# Patient Record
Sex: Female | Born: 1950 | Race: White | Hispanic: No | Marital: Married | Smoking: Never smoker
Health system: Southern US, Community
[De-identification: ages and names within clinical notes are randomized; demographics above are authoritative.]

## PROBLEM LIST (undated history)

## (undated) DIAGNOSIS — N838 Other noninflammatory disorders of ovary, fallopian tube and broad ligament: Secondary | ICD-10-CM

## (undated) DIAGNOSIS — K219 Gastro-esophageal reflux disease without esophagitis: Secondary | ICD-10-CM

## (undated) HISTORY — PX: TONSILLECTOMY: SUR1361

## (undated) HISTORY — PX: DIAGNOSTIC LAPAROSCOPY: SUR761

## (undated) HISTORY — DX: Other noninflammatory disorders of ovary, fallopian tube and broad ligament: N83.8

## (undated) HISTORY — DX: Gastro-esophageal reflux disease without esophagitis: K21.9

---

## 1997-12-10 ENCOUNTER — Other Ambulatory Visit: Admission: RE | Admit: 1997-12-10 | Discharge: 1997-12-10 | Payer: Self-pay | Admitting: Obstetrics & Gynecology

## 1998-12-20 ENCOUNTER — Other Ambulatory Visit: Admission: RE | Admit: 1998-12-20 | Discharge: 1998-12-20 | Payer: Self-pay | Admitting: Obstetrics & Gynecology

## 1999-06-29 ENCOUNTER — Emergency Department (HOSPITAL_COMMUNITY): Admission: EM | Admit: 1999-06-29 | Discharge: 1999-06-29 | Payer: Self-pay | Admitting: Emergency Medicine

## 1999-07-06 ENCOUNTER — Emergency Department (HOSPITAL_COMMUNITY): Admission: EM | Admit: 1999-07-06 | Discharge: 1999-07-06 | Payer: Self-pay | Admitting: Emergency Medicine

## 2000-03-15 ENCOUNTER — Emergency Department (HOSPITAL_COMMUNITY): Admission: EM | Admit: 2000-03-15 | Discharge: 2000-03-15 | Payer: Self-pay | Admitting: Emergency Medicine

## 2000-03-15 ENCOUNTER — Encounter: Payer: Self-pay | Admitting: Emergency Medicine

## 2000-05-21 ENCOUNTER — Other Ambulatory Visit: Admission: RE | Admit: 2000-05-21 | Discharge: 2000-05-21 | Payer: Self-pay | Admitting: Obstetrics & Gynecology

## 2001-09-09 ENCOUNTER — Other Ambulatory Visit: Admission: RE | Admit: 2001-09-09 | Discharge: 2001-09-09 | Payer: Self-pay | Admitting: Obstetrics & Gynecology

## 2003-02-20 ENCOUNTER — Other Ambulatory Visit: Admission: RE | Admit: 2003-02-20 | Discharge: 2003-02-20 | Payer: Self-pay | Admitting: Obstetrics & Gynecology

## 2003-05-14 ENCOUNTER — Encounter (INDEPENDENT_AMBULATORY_CARE_PROVIDER_SITE_OTHER): Payer: Self-pay | Admitting: Specialist

## 2003-05-14 ENCOUNTER — Ambulatory Visit (HOSPITAL_COMMUNITY): Admission: RE | Admit: 2003-05-14 | Discharge: 2003-05-14 | Payer: Self-pay | Admitting: Obstetrics & Gynecology

## 2006-06-02 ENCOUNTER — Other Ambulatory Visit: Admission: RE | Admit: 2006-06-02 | Discharge: 2006-06-02 | Payer: Self-pay | Admitting: Family Medicine

## 2006-06-10 ENCOUNTER — Encounter: Admission: RE | Admit: 2006-06-10 | Discharge: 2006-06-10 | Payer: Self-pay | Admitting: Family Medicine

## 2006-06-22 ENCOUNTER — Encounter: Admission: RE | Admit: 2006-06-22 | Discharge: 2006-06-22 | Payer: Self-pay | Admitting: Family Medicine

## 2007-09-15 ENCOUNTER — Encounter: Admission: RE | Admit: 2007-09-15 | Discharge: 2007-09-15 | Payer: Self-pay | Admitting: Family Medicine

## 2007-09-20 ENCOUNTER — Other Ambulatory Visit: Admission: RE | Admit: 2007-09-20 | Discharge: 2007-09-20 | Payer: Self-pay | Admitting: Family Medicine

## 2007-09-26 ENCOUNTER — Encounter: Admission: RE | Admit: 2007-09-26 | Discharge: 2007-09-26 | Payer: Self-pay | Admitting: Family Medicine

## 2007-11-09 ENCOUNTER — Other Ambulatory Visit: Admission: RE | Admit: 2007-11-09 | Discharge: 2007-11-09 | Payer: Self-pay | Admitting: Family Medicine

## 2009-10-25 ENCOUNTER — Encounter: Admission: RE | Admit: 2009-10-25 | Discharge: 2009-10-25 | Payer: Self-pay | Admitting: Family Medicine

## 2010-01-27 ENCOUNTER — Ambulatory Visit (HOSPITAL_COMMUNITY): Admission: RE | Admit: 2010-01-27 | Discharge: 2010-01-27 | Payer: Self-pay | Admitting: Family Medicine

## 2010-01-30 ENCOUNTER — Encounter: Admission: RE | Admit: 2010-01-30 | Discharge: 2010-01-30 | Payer: Self-pay | Admitting: Family Medicine

## 2010-02-26 ENCOUNTER — Ambulatory Visit
Admission: RE | Admit: 2010-02-26 | Discharge: 2010-02-26 | Payer: Self-pay | Source: Home / Self Care | Admitting: Gynecologic Oncology

## 2010-04-11 ENCOUNTER — Encounter
Admission: RE | Admit: 2010-04-11 | Discharge: 2010-04-11 | Payer: Self-pay | Source: Home / Self Care | Attending: Family Medicine | Admitting: Family Medicine

## 2010-04-13 ENCOUNTER — Encounter: Payer: Self-pay | Admitting: Family Medicine

## 2010-04-14 ENCOUNTER — Encounter: Payer: Self-pay | Admitting: Family Medicine

## 2010-04-28 ENCOUNTER — Other Ambulatory Visit: Payer: Self-pay | Admitting: Family Medicine

## 2010-04-28 ENCOUNTER — Other Ambulatory Visit (HOSPITAL_COMMUNITY)
Admission: RE | Admit: 2010-04-28 | Discharge: 2010-04-28 | Disposition: A | Discharge status: Home / Self Care | Payer: BC Managed Care – PPO | Source: Ambulatory Visit | Attending: Family Medicine | Admitting: Family Medicine

## 2010-04-28 DIAGNOSIS — Z124 Encounter for screening for malignant neoplasm of cervix: Secondary | ICD-10-CM | POA: Insufficient documentation

## 2010-08-08 NOTE — Op Note (Signed)
NAMECORTEZ, STEELMAN                     ACCOUNT NO.:  [id number]   MEDICAL RECORD NO.:  [id number]                   PATIENT TYPE:  AMB   LOCATION:  SDC                                  FACILITY:  [facility]   PHYSICIAN:  Freddy Finner, M.D.                DATE OF BIRTH:  1950/05/25   DATE OF PROCEDURE:  05/14/2003  DATE OF DISCHARGE:                                 OPERATIVE REPORT   PREOPERATIVE DIAGNOSIS:  Postmenopausal bleeding, endometrial polyp,  endometrial fibroids.   POSTOPERATIVE DIAGNOSIS:  Postmenopausal bleeding, endometrial polyp,  endometrial fibroids.  Plus thick, large uterine septum.   PROCEDURE:  Hysteroscopy, D&C, resection of endometrial polyp.   ANESTHESIA:  General.   COMPLICATIONS:  None.   ESTIMATED BLOOD LOSS:  10 mL.   SORBITOL DEFICIT:  60 mL.   INDICATIONS FOR PROCEDURE:  The patient is a 60 year old who had an episode  of postmenopausal bleeding following starting hormonal replacement therapy.  Sonohysterogram in the office did reveal an endometrial polyp.  She is now  admitted for hysteroscopy and D&C.   FINDINGS:  Operative findings were recorded in still photographs which are  retained in the office record.   DESCRIPTION OF PROCEDURE:  The patient was admitted on the morning of  surgery and brought to the operating room.  There, placed under adequate  general anesthesia, and placed in the dorsal lithotomy position using Allen  stirrups system.  Betadine prep of mons, perineum, and vagina was carried  out in the usual fashion. Sterile drapes were applied.  Bivalve speculum was  introduced.  Cervix was visualized, grasped on the anterior cervical lip  with a single tooth tenaculum.  Paracervical block was placed using 10 mL of  1% plain Xylocaine 5 mL were injected at each of the 4 and 8 o'clock  positions.  Uterus sounded to approximately 9 cm.  The cervix was  progressively dilated to 23 with [facility] dilators.  12.5 degree ACMI  hysteroscope was introduced using 3% Sorbitol as the distending medium.  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  The polyp was actually on the anterior surface of the  endometrium just below the septum.  Gentle thorough curettage and  exploration with Randall stone forceps was carried out.  Reinspection  revealed adequate resection of the polyp and adequate sampling of the  endometrium.  The instruments were then removed. The patient was awakened  and taken to the recovery room in good condition.                                               Freddy Finner, M.D.  WRN/MEDQ  D:  05/14/2003  T:  05/14/2003  Job:  [id number]

## 2010-08-25 ENCOUNTER — Other Ambulatory Visit: Payer: Self-pay | Admitting: Gynecologic Oncology

## 2010-08-25 DIAGNOSIS — N83202 Unspecified ovarian cyst, left side: Secondary | ICD-10-CM

## 2010-08-25 DIAGNOSIS — N838 Other noninflammatory disorders of ovary, fallopian tube and broad ligament: Secondary | ICD-10-CM

## 2010-09-08 ENCOUNTER — Other Ambulatory Visit: Payer: BC Managed Care – PPO

## 2010-09-08 ENCOUNTER — Ambulatory Visit
Admission: RE | Admit: 2010-09-08 | Discharge: 2010-09-08 | Disposition: A | Discharge status: Home / Self Care | Payer: BC Managed Care – PPO | Source: Ambulatory Visit | Attending: Gynecologic Oncology | Admitting: Gynecologic Oncology

## 2010-09-08 DIAGNOSIS — N838 Other noninflammatory disorders of ovary, fallopian tube and broad ligament: Secondary | ICD-10-CM

## 2010-09-10 ENCOUNTER — Other Ambulatory Visit: Payer: Self-pay | Admitting: Gynecologic Oncology

## 2010-09-10 ENCOUNTER — Ambulatory Visit: Payer: BC Managed Care – PPO | Attending: Gynecologic Oncology | Admitting: Gynecologic Oncology

## 2010-09-10 DIAGNOSIS — R32 Unspecified urinary incontinence: Secondary | ICD-10-CM | POA: Insufficient documentation

## 2010-09-10 DIAGNOSIS — N9489 Other specified conditions associated with female genital organs and menstrual cycle: Secondary | ICD-10-CM | POA: Insufficient documentation

## 2010-09-10 DIAGNOSIS — D251 Intramural leiomyoma of uterus: Secondary | ICD-10-CM | POA: Insufficient documentation

## 2010-09-10 DIAGNOSIS — N83209 Unspecified ovarian cyst, unspecified side: Secondary | ICD-10-CM

## 2010-09-11 NOTE — Consult Note (Signed)
Sophia [name], Sophia [name]           ACCOUNT NO.:  [id number]  MEDICAL RECORD NO.:  [id number]  LOCATION:  GYN                          FACILITY:  [facility]  PHYSICIAN:  Lenea Bywater A. 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  This was initially noted in November 2011.  It was followed up with an MRI of the pelvis in November 2011 that revealed a 2 mm endometrial stripe in the uterus. There were small intramural myomas.  The right ovary was small, measuring 19 x 9 x 20 mm, left ovary was slightly larger, measuring 20 x 26 x 29 mm with several foci of low signal intensity along the inferior margin of the ovary, which corresponded to areas of calcification noted on ultrasound.  The conclusion was that this could be a small solid neoplasm and close followup was requested and recommended.  I saw her, we talked about getting a CA-125, which was normal and after our discussion, she wish to opt for close followup.  She was seen by Dr. Duard Larsen regarding her incontinence and she is currently undergoing PT and that is helping significantly with her leakage.  She did undergo a repeat ultrasound on June 18th.  The uterus measured 7.7 x 3.1 x 4.4 cm.  The myometrium is heterogeneous consistent with her known fibroids. The endometrial thickness is normal at 2.5 mm.  The right ovary is 2.8 x 1.1 x 1.9 cm and has a normal appearance.  The left ovary measures 2.9 x 1.5 x 2.8 cm.  There is a focal mass measuring 1.6 x 1.3 x 1.6 cm, which contains a diffuse echoes with posterior acoustical shadowing compatible with calcifications.  There has been no  significant interval change in the size or appearance noted to that from November 2011.  They state there are no features to suggest a dermoid on MRI despite the lack of growth over 6 months, suggesting a benign etiology of low malignant potential, early ovarian carcinoma could not be excluded, and they recommend followup in 3-6 months.  The patient has been quite some time discussing these options today.  She is currently asymptomatic, does not have any pain.  Her incontinence is better.  We discussed the options of either proceeding with surgery at this point, waiting to see if she has pain, waiting to see if she needs a urologic procedure done, etc.  After discussing all the options if she is not in pain and does not require any surgery from a urologic perspective, she would like to continue following this conservatively and avoid surgery, which I think is quite reasonable.  PHYSICAL EXAMINATION:  VITAL SIGNS:  Weight 149 pounds, which is down 4 pounds from her last visit; blood pressure 110/68; pulse 76. GENERAL:  A well-nourished, well-developed female, in no acute distress.  Physical examination was  deferred.  20 minutes face-to-face time was spent discussing the ultrasound from today as well as going back to those in November and the MRI.  After our discussion, the patient wishes to follow this conservatively and we will schedule her for a repeat ultrasound in 3 months and visit with me that same day.  She understands that while we do not believe this is a malignant process, the only way to know that for certain would be to proceed with surgical intervention, but at this point she is comfortable not doing that.  She will call me if there are any interval changes and any symptoms prior to that.     Sophia [name] A. Duard Brady, MD     PAG/MEDQ  D:  09/10/2010  T:  09/11/2010  Job:  [id number]  cc:   Pam Drown, M.D. Fax: [phone]  Telford Nab, R.N. [address] [place], [place] [address]  Randye Lobo, M.D. Fax: [phone]  Electronically Signed by Cleda Mccreedy MD on 09/11/2010 02:54:08 PM

## 2010-12-09 ENCOUNTER — Ambulatory Visit
Admission: RE | Admit: 2010-12-09 | Discharge: 2010-12-09 | Disposition: A | Discharge status: Home / Self Care | Payer: BC Managed Care – PPO | Source: Ambulatory Visit | Attending: Gynecologic Oncology | Admitting: Gynecologic Oncology

## 2010-12-09 ENCOUNTER — Other Ambulatory Visit (HOSPITAL_COMMUNITY): Payer: BC Managed Care – PPO

## 2010-12-09 DIAGNOSIS — N83209 Unspecified ovarian cyst, unspecified side: Secondary | ICD-10-CM

## 2010-12-10 ENCOUNTER — Ambulatory Visit: Payer: BC Managed Care – PPO | Attending: Gynecologic Oncology | Admitting: Gynecologic Oncology

## 2010-12-10 DIAGNOSIS — D259 Leiomyoma of uterus, unspecified: Secondary | ICD-10-CM | POA: Insufficient documentation

## 2010-12-10 DIAGNOSIS — M2170 Unequal limb length (acquired), unspecified site: Secondary | ICD-10-CM | POA: Insufficient documentation

## 2010-12-10 DIAGNOSIS — R32 Unspecified urinary incontinence: Secondary | ICD-10-CM | POA: Insufficient documentation

## 2010-12-10 DIAGNOSIS — N839 Noninflammatory disorder of ovary, fallopian tube and broad ligament, unspecified: Secondary | ICD-10-CM | POA: Insufficient documentation

## 2010-12-10 DIAGNOSIS — M129 Arthropathy, unspecified: Secondary | ICD-10-CM | POA: Insufficient documentation

## 2010-12-12 NOTE — Consult Note (Signed)
NAMEYURIANA, [name]           ACCOUNT NO.:  [id number]  MEDICAL RECORD NO.:  [id number]  LOCATION:  GYN                          FACILITY:  [facility]  PHYSICIAN:  Estefano Victory A. Duard Brady, MD    DATE OF BIRTH:  13-Feb-1951  DATE OF CONSULTATION:  12/10/2010 DATE OF DISCHARGE:                                CONSULTATION   HISTORY OF PRESENT ILLNESS:  Sophia [name] is a very pleasant 60 year old who was initially referred to [place] secondary to pain, which prompted a workup including an ultrasound that revealed a left ovarian complex avascular mass measuring 1.9 x 1.7 x 1.6 cm.  This was initially noticed in November 2011.  She had an MRI of the pelvis at that time that revealed some intrauterine myomas, a 2-mm endometrial stripe.  The left ovary was slightly larger than the right ovary, measuring 2 x 2.6 x 2.9 cm with several foci of low-signal intensity along the inferior margin of the ovary corresponding to areas of calcification noted on ultrasound.  CA-125 was normal.  Serial ultrasounds have been unremarkable.  After a lengthy discussion of the patient with me, which is well documented in my note from June 20, she opted for close followup.  She had a repeat ultrasound on September 18 that revealed the uterus to be 8.2 x 3.6 x 5.3 cm with stable small uterine fibroids with calcifications.  She has a normal endometrial thickness at 1.4 mm.  The right ovary is normal, measuring 3 x 1.1 x 1.4 cm with no cysts or masses.  Within the left ovary, which measures 3.5 x 1.3 x 2.2 cm, there was a stable, partially calcified shadowing 1.9 x 1.2 x 2 cm lesion associated with the left ovary that is felt most likely to be benign giving the stability and a prior CT scan appearance.  She comes in today to discuss these findings.  She is overall doing quite well.  She has been followed by Dr. Conley Simmonds for urinary incontinence and is undergoing physical therapy and biofeedback at [facility].   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  Denies any pain or bleeding.  Her incontinence is improved significantly.  Before she was changing a pad four to five times a day and the pads would be full of urine.  Now she will go all day long without having to wear or change a pad, occasionally when she wore a pad, but feels that it is usually when she is drinking a lot of fluids.  She denies any pelvic pain or pressure, early satiety, other change in bowel or bladder habits.  PHYSICAL EXAMINATION:  VITAL SIGNS:  Weight 151 pounds, blood pressure 100/60, pulse 70, respirations 20, temperature 97.8.  GENERAL:  Well- nourished, well-developed female in no acute distress.  Physical examination was deferred.  ASSESSMENT/PLAN:  15 minutes face-to-face time was spent with the patient going over her ultrasound and plan of care.  At this point, she would very much like to continue following this ovarian mass  and does not want to have surgery if at all possible.  She is very pleased with how she is doing with her biofeedback and her physical therapy.  The plan at this point is that she will repeat an ultrasound in 3 months and return to see me at that time.  If it continues to be stable, there will be 1 year follow up instability, and we can either choose to not follow it any more, though she states that if she ends up needing to have surgery for her urinary incontinence, she may want to have it done at the same time.  I discussed with her that we can clearly talk about that.  However, if she needs a vaginal procedure for her urinary incontinence, it may not make sense to proceed with an abdominal procedure at that same time, which she clearly understands.  She is aware and alerted to the signs and symptoms of ovarian cancer.  Will  contact us if there is any issues prior to her next visit.     Kaelon Weekes A. Duard Brady, MD     PAG/MEDQ  D:  12/10/2010  T:  12/10/2010  Job:  [id number]  cc:   Pam Drown, M.D. Fax: [phone]  Telford Nab, R.N. [address] [place], [place] [address]  Randye Lobo, M.D. Fax: [phone]  Electronically Signed by Cleda Mccreedy MD on 12/12/2010 07:48:42 AM

## 2010-12-23 ENCOUNTER — Other Ambulatory Visit: Payer: Self-pay | Admitting: Gynecologic Oncology

## 2010-12-23 DIAGNOSIS — N83202 Unspecified ovarian cyst, left side: Secondary | ICD-10-CM

## 2011-02-18 ENCOUNTER — Other Ambulatory Visit: Payer: Self-pay | Admitting: Family Medicine

## 2011-02-18 DIAGNOSIS — Z1231 Encounter for screening mammogram for malignant neoplasm of breast: Secondary | ICD-10-CM

## 2011-03-04 ENCOUNTER — Ambulatory Visit
Admission: RE | Admit: 2011-03-04 | Discharge: 2011-03-04 | Disposition: A | Discharge status: Home / Self Care | Payer: BC Managed Care – PPO | Source: Ambulatory Visit | Attending: Gynecologic Oncology | Admitting: Gynecologic Oncology

## 2011-03-04 DIAGNOSIS — N83202 Unspecified ovarian cyst, left side: Secondary | ICD-10-CM

## 2011-03-05 ENCOUNTER — Ambulatory Visit
Admission: RE | Admit: 2011-03-05 | Discharge: 2011-03-05 | Disposition: A | Discharge status: Home / Self Care | Payer: BC Managed Care – PPO | Source: Ambulatory Visit | Attending: Family Medicine | Admitting: Family Medicine

## 2011-03-05 DIAGNOSIS — Z1231 Encounter for screening mammogram for malignant neoplasm of breast: Secondary | ICD-10-CM

## 2011-03-10 ENCOUNTER — Encounter: Payer: Self-pay | Admitting: Gynecologic Oncology

## 2011-03-11 ENCOUNTER — Encounter: Payer: Self-pay | Admitting: Gynecologic Oncology

## 2011-03-11 ENCOUNTER — Ambulatory Visit: Payer: BC Managed Care – PPO | Attending: Gynecologic Oncology | Admitting: Gynecologic Oncology

## 2011-03-11 VITALS — BP 120/64 | HR 70 | Temp 98.0°F | Resp 20 | Ht 60.0 in | Wt 148.0 lb

## 2011-03-11 DIAGNOSIS — N839 Noninflammatory disorder of ovary, fallopian tube and broad ligament, unspecified: Secondary | ICD-10-CM | POA: Insufficient documentation

## 2011-03-11 DIAGNOSIS — N838 Other noninflammatory disorders of ovary, fallopian tube and broad ligament: Secondary | ICD-10-CM | POA: Insufficient documentation

## 2011-03-11 DIAGNOSIS — R32 Unspecified urinary incontinence: Secondary | ICD-10-CM | POA: Insufficient documentation

## 2011-03-11 DIAGNOSIS — K219 Gastro-esophageal reflux disease without esophagitis: Secondary | ICD-10-CM | POA: Insufficient documentation

## 2011-03-11 NOTE — Progress Notes (Signed)
Consult Note: Gyn-Onc  Sophia [name] 60 y.o. female  CC:  Chief Complaint  Patient presents with  . Ovarian mass    Follow up    HPI: HISTORY OF PRESENT ILLNESS: Sophia [name] is a very pleasant 60 year old who was initially referred to [place] secondary to pain, which prompted a workup including an ultrasound that revealed a left ovarian complex avascular mass measuring 1.9 x 1.7 x 1.6 cm. This was initially noticed in November 2011. She had an MRI of the pelvis at that time that revealed some intrauterine myomas, a 2-mm endometrial stripe. The left  ovary was slightly larger than the right ovary, measuring 2 x 2.6 x 2.9 cm with several foci of low-signal intensity along the inferior margin  of the ovary corresponding to areas of calcification noted on ultrasound. CA-125 was normal. Serial ultrasounds have been  unremarkable. After a lengthy discussion of the patient with me, which is well documented in my note from June 20, she opted for close  followup. She had a repeat ultrasound on September 18 that revealed the uterus to be 8.2 x 3.6 x 5.3 cm with stable small uterine fibroids with  calcifications. She has a normal endometrial thickness at 1.4 mm. The right ovary is normal, measuring 3 x 1.1 x 1.4 cm with no cysts or  masses. Within the left ovary, which measures 3.5 x 1.3 x 2.2 cm, there was a stable, partially calcified shadowing 1.9 x 1.2 x 2 cm lesion  associated with the left ovary that is felt most likely to be benign giving the stability and a prior CT scan appearance.   Repeat ultrasound was performed December 12 been compared ultrasounds from September of 2000, 02/22/2011, and November 2011. It revealed the left ovary to measure 2.9 but 1.9 x 2.3 cm. The calcified solid lesion again was seen in the inferior aspect of the ovary measuring 1.8 x 1.1cm x 1.6 cm. It is unchanged.   She comes in today to discuss these findings. She is overall doing quite well. She has been followed  by Dr. Conley Simmonds for urinary incontinence and is undergoing physical therapy and biofeedback at [facility].Denies any pain or bleeding. Her incontinence is improved significantly. Before she was changing a pad four to five times a day and the pads would be full of urine. Now she will go all day long without having to wear or change a pad, occasionally when she wore a pad, but feels that it is usually when she is drinking a lot of fluids. She denies any pelvic pain or pressure, early satiety, other change in bowel or bladder habits. She feels that there are certain foods and drinks it might make her incontinence worse but overall there's been a significant improvement.  Interval History:   Review of Systems  Current Meds:  Outpatient Encounter Prescriptions as of 03/11/2011  Medication Sig Dispense Refill  . VITAMIN D, CHOLECALCIFEROL, PO Take by mouth.          Allergy: No Known Allergies  Social Hx:   History   Social History  . Marital Status: Married    Spouse Name: N/A    Number of Children: N/A  . Years of Education: N/A   Occupational History  . Not on file.   Social History Main Topics  . Smoking status: Never Smoker   . Smokeless tobacco: Not on file  . Alcohol Use: Yes     minimal  . Drug Use: No  .  Sexually Active: No   Other Topics Concern  . Not on file   Social History Narrative  . No narrative on file    Past Surgical Hx:  Past Surgical History  Procedure Date  . Tonsillectomy   . Diagnostic laparoscopy 15 yrs ago    Past Medical Hx:  Past Medical History  Diagnosis Date  . Urinary incontinence   . GERD (gastroesophageal reflux disease)   . Ovarian mass     left    Family Hx:  Family History  Problem Relation Age of Onset  . Lupus Mother   . COPD Mother   . Uterine cancer Other     Vitals:  Blood pressure 120/64, pulse 70, temperature 98 F (36.7 C), resp. rate 20, height 5' (1.524 m), weight 148 lb (67.132 kg).  Physical  Exam: Well-nourished well-developed female in no acute distress.  Assessment/Plan: 60 year old with a solid 1-2 cm ovarian mass has been stable over more than one year. I do not believe that this represents malignancy.  She is very pleased to hear that. I would recommend that she have another ultrasound in 6 months and is to be followed by Dr. Uvaldo Rising. She'll be released from our clinic and she does all be happy to see her in the future should the need arise. We again discussed signs and symptoms of ovarian carcinoma. She is well versed in these. She'll return to see [place] should something change.  Alanny Rivers A., MD 03/11/2011, 1:27 PM

## 2011-03-11 NOTE — Patient Instructions (Signed)
Followup with an ultrasound and Dr. Uvaldo Rising in 6 months.

## 2011-08-10 ENCOUNTER — Other Ambulatory Visit: Payer: Self-pay | Admitting: Family Medicine

## 2011-08-10 DIAGNOSIS — N83209 Unspecified ovarian cyst, unspecified side: Secondary | ICD-10-CM

## 2011-09-01 ENCOUNTER — Ambulatory Visit
Admission: RE | Admit: 2011-09-01 | Discharge: 2011-09-01 | Disposition: A | Discharge status: Home / Self Care | Payer: BC Managed Care – PPO | Source: Ambulatory Visit | Attending: Family Medicine | Admitting: Family Medicine

## 2011-09-01 DIAGNOSIS — N83209 Unspecified ovarian cyst, unspecified side: Secondary | ICD-10-CM

## 2011-09-16 ENCOUNTER — Other Ambulatory Visit: Payer: BC Managed Care – PPO

## 2011-12-22 ENCOUNTER — Other Ambulatory Visit: Payer: Self-pay | Admitting: Family Medicine

## 2011-12-22 DIAGNOSIS — Z1231 Encounter for screening mammogram for malignant neoplasm of breast: Secondary | ICD-10-CM

## 2012-01-25 ENCOUNTER — Other Ambulatory Visit: Payer: Self-pay | Admitting: Family Medicine

## 2012-01-25 DIAGNOSIS — Z780 Asymptomatic menopausal state: Secondary | ICD-10-CM

## 2012-03-09 ENCOUNTER — Ambulatory Visit: Payer: BC Managed Care – PPO

## 2012-03-09 ENCOUNTER — Other Ambulatory Visit: Payer: BC Managed Care – PPO

## 2012-04-13 ENCOUNTER — Ambulatory Visit
Admission: RE | Admit: 2012-04-13 | Discharge: 2012-04-13 | Disposition: A | Discharge status: Home / Self Care | Payer: BC Managed Care – PPO | Source: Ambulatory Visit | Attending: Family Medicine | Admitting: Family Medicine

## 2012-04-13 DIAGNOSIS — Z780 Asymptomatic menopausal state: Secondary | ICD-10-CM

## 2012-04-13 DIAGNOSIS — Z1231 Encounter for screening mammogram for malignant neoplasm of breast: Secondary | ICD-10-CM

## 2012-09-01 ENCOUNTER — Other Ambulatory Visit: Payer: Self-pay | Admitting: Family Medicine

## 2012-09-01 DIAGNOSIS — D3912 Neoplasm of uncertain behavior of left ovary: Secondary | ICD-10-CM

## 2012-09-06 ENCOUNTER — Ambulatory Visit
Admission: RE | Admit: 2012-09-06 | Discharge: 2012-09-06 | Disposition: A | Discharge status: Home / Self Care | Payer: BC Managed Care – PPO | Source: Ambulatory Visit | Attending: Family Medicine | Admitting: Family Medicine

## 2012-09-06 DIAGNOSIS — D3912 Neoplasm of uncertain behavior of left ovary: Secondary | ICD-10-CM

## 2013-12-01 ENCOUNTER — Other Ambulatory Visit: Payer: Self-pay | Admitting: Family Medicine

## 2013-12-01 DIAGNOSIS — N83209 Unspecified ovarian cyst, unspecified side: Secondary | ICD-10-CM

## 2013-12-08 ENCOUNTER — Ambulatory Visit
Admission: RE | Admit: 2013-12-08 | Discharge: 2013-12-08 | Disposition: A | Discharge status: Home / Self Care | Payer: BC Managed Care – PPO | Source: Ambulatory Visit | Attending: Family Medicine | Admitting: Family Medicine

## 2013-12-08 DIAGNOSIS — N83209 Unspecified ovarian cyst, unspecified side: Secondary | ICD-10-CM

## 2015-03-11 ENCOUNTER — Other Ambulatory Visit: Payer: Self-pay

## 2015-03-11 DIAGNOSIS — Z1231 Encounter for screening mammogram for malignant neoplasm of breast: Secondary | ICD-10-CM

## 2015-03-12 ENCOUNTER — Other Ambulatory Visit: Payer: Self-pay | Admitting: Family Medicine

## 2015-03-12 DIAGNOSIS — E2839 Other primary ovarian failure: Secondary | ICD-10-CM

## 2015-04-15 ENCOUNTER — Ambulatory Visit: Payer: BC Managed Care – PPO

## 2015-04-15 ENCOUNTER — Ambulatory Visit
Admission: RE | Admit: 2015-04-15 | Discharge: 2015-04-15 | Disposition: A | Discharge status: Home / Self Care | Payer: Medicare Other | Source: Ambulatory Visit

## 2015-04-15 ENCOUNTER — Ambulatory Visit
Admission: RE | Admit: 2015-04-15 | Discharge: 2015-04-15 | Disposition: A | Discharge status: Home / Self Care | Payer: Medicare Other | Source: Ambulatory Visit | Attending: Family Medicine | Admitting: Family Medicine

## 2015-04-15 DIAGNOSIS — E2839 Other primary ovarian failure: Secondary | ICD-10-CM

## 2015-04-15 DIAGNOSIS — Z1231 Encounter for screening mammogram for malignant neoplasm of breast: Secondary | ICD-10-CM

## 2015-08-20 ENCOUNTER — Ambulatory Visit
Admission: RE | Admit: 2015-08-20 | Discharge: 2015-08-20 | Disposition: A | Discharge status: Home / Self Care | Payer: Medicare Other | Source: Ambulatory Visit | Attending: Physician Assistant | Admitting: Physician Assistant

## 2015-08-20 ENCOUNTER — Other Ambulatory Visit: Payer: Self-pay | Admitting: Physician Assistant

## 2015-08-20 DIAGNOSIS — M542 Cervicalgia: Secondary | ICD-10-CM

## 2015-09-16 ENCOUNTER — Other Ambulatory Visit: Payer: Self-pay | Admitting: Family Medicine

## 2015-09-16 ENCOUNTER — Other Ambulatory Visit (HOSPITAL_COMMUNITY)
Admission: RE | Admit: 2015-09-16 | Discharge: 2015-09-16 | Disposition: A | Discharge status: Home / Self Care | Payer: Medicare Other | Source: Ambulatory Visit | Attending: Family Medicine | Admitting: Family Medicine

## 2015-09-16 DIAGNOSIS — Z124 Encounter for screening for malignant neoplasm of cervix: Secondary | ICD-10-CM | POA: Diagnosis present

## 2015-09-20 LAB — CYTOLOGY - PAP

## 2016-02-27 ENCOUNTER — Other Ambulatory Visit: Payer: Self-pay | Admitting: Family Medicine

## 2016-02-27 DIAGNOSIS — M792 Neuralgia and neuritis, unspecified: Secondary | ICD-10-CM

## 2016-03-10 ENCOUNTER — Ambulatory Visit
Admission: RE | Admit: 2016-03-10 | Discharge: 2016-03-10 | Disposition: A | Discharge status: Home / Self Care | Payer: Medicare Other | Source: Ambulatory Visit | Attending: Family Medicine | Admitting: Family Medicine

## 2016-03-10 DIAGNOSIS — M792 Neuralgia and neuritis, unspecified: Secondary | ICD-10-CM

## 2016-03-10 MED ORDER — GADOBENATE DIMEGLUMINE 529 MG/ML IV SOLN
14.0000 mL | Freq: Once | INTRAVENOUS | Status: AC | PRN
  Administered 2016-03-10: 14 mL via INTRAVENOUS

## 2017-09-27 ENCOUNTER — Other Ambulatory Visit: Payer: Self-pay | Admitting: Family Medicine

## 2017-09-27 DIAGNOSIS — Z1231 Encounter for screening mammogram for malignant neoplasm of breast: Secondary | ICD-10-CM

## 2017-10-18 ENCOUNTER — Ambulatory Visit
Admission: RE | Admit: 2017-10-18 | Discharge: 2017-10-18 | Disposition: A | Discharge status: Home / Self Care | Payer: Medicare Other | Source: Ambulatory Visit | Attending: Family Medicine | Admitting: Family Medicine

## 2017-10-18 DIAGNOSIS — Z1231 Encounter for screening mammogram for malignant neoplasm of breast: Secondary | ICD-10-CM

## 2019-04-06 IMAGING — MG DIGITAL SCREENING BILATERAL MAMMOGRAM WITH TOMO AND CAD
8 series · 9 of 24 positions shown · non-contrast
Comparison: Previous exam(s).

CLINICAL DATA: Screening.

EXAM:
DIGITAL SCREENING BILATERAL MAMMOGRAM WITH TOMO AND CAD

[L CC synth-2D]
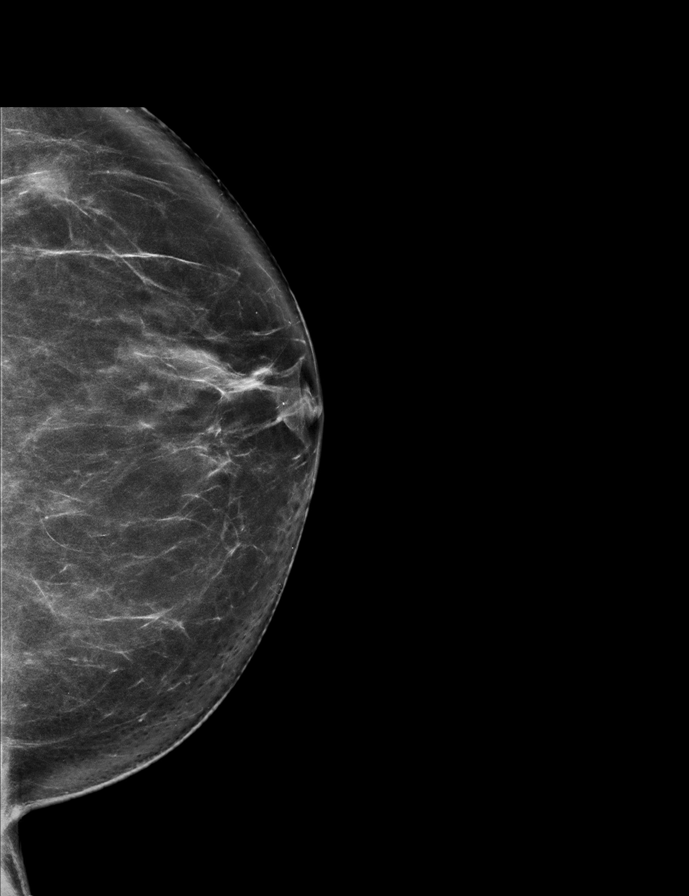

[L MLO synth-2D]
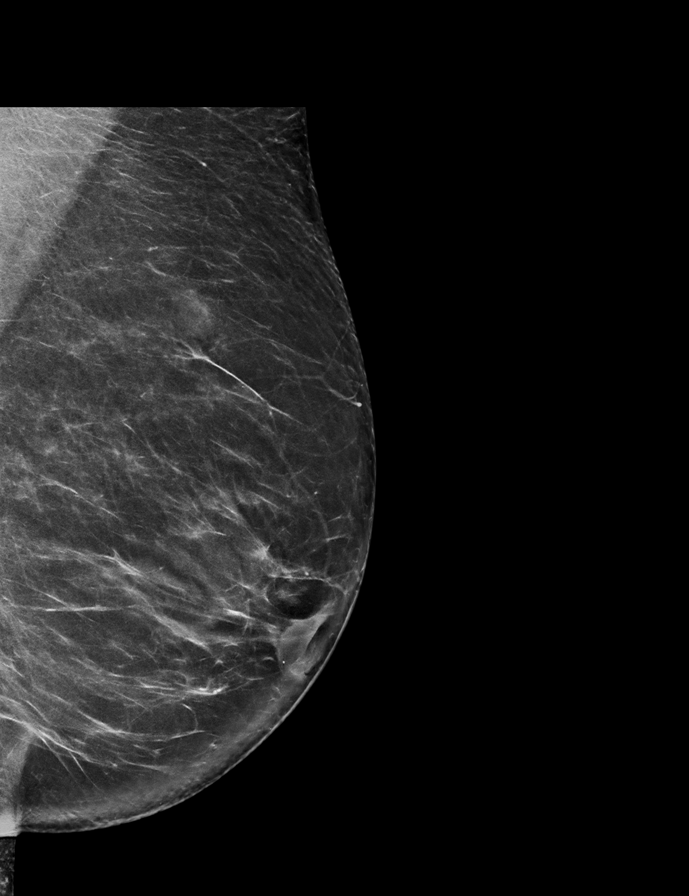

[R MLO synth-2D]
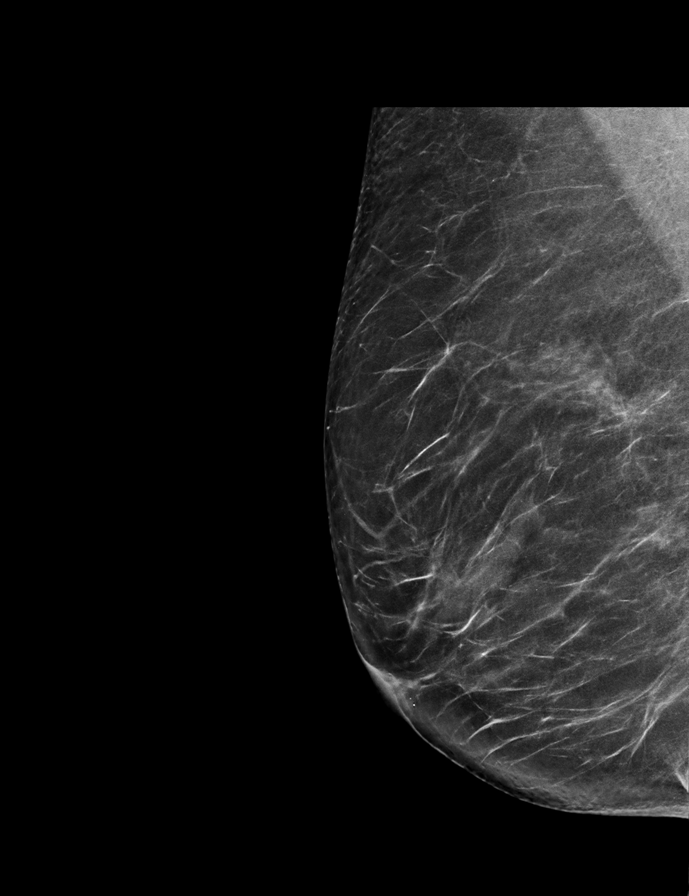

[R CC synth-2D]
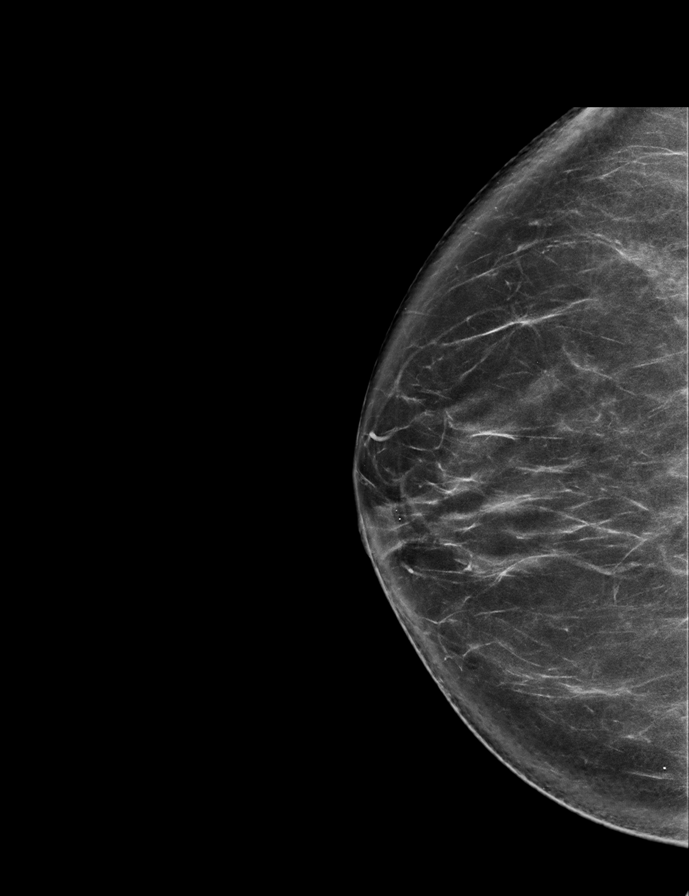

[R MLO tomo · 2 of 76 frames shown]
[frame 25/76]
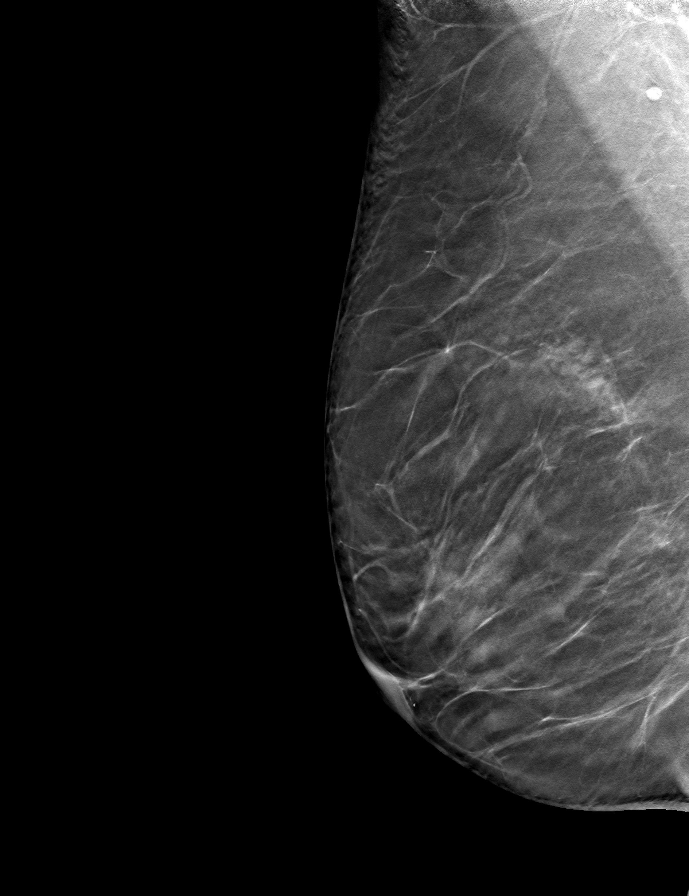
[frame 39/76]
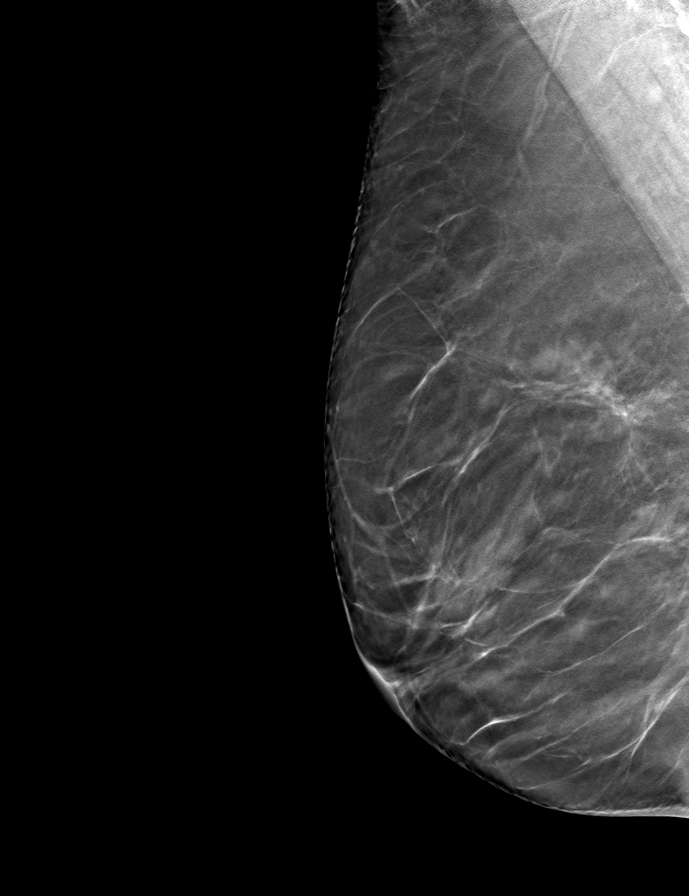

[L CC tomo · tomo slice 39/78.0]
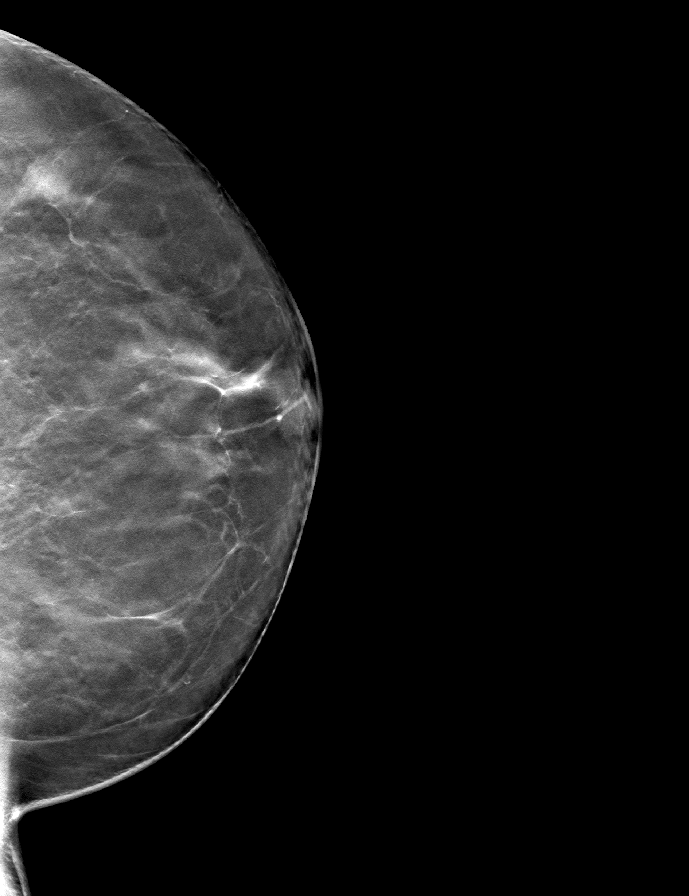

[L MLO tomo · tomo slice 41/80.0]
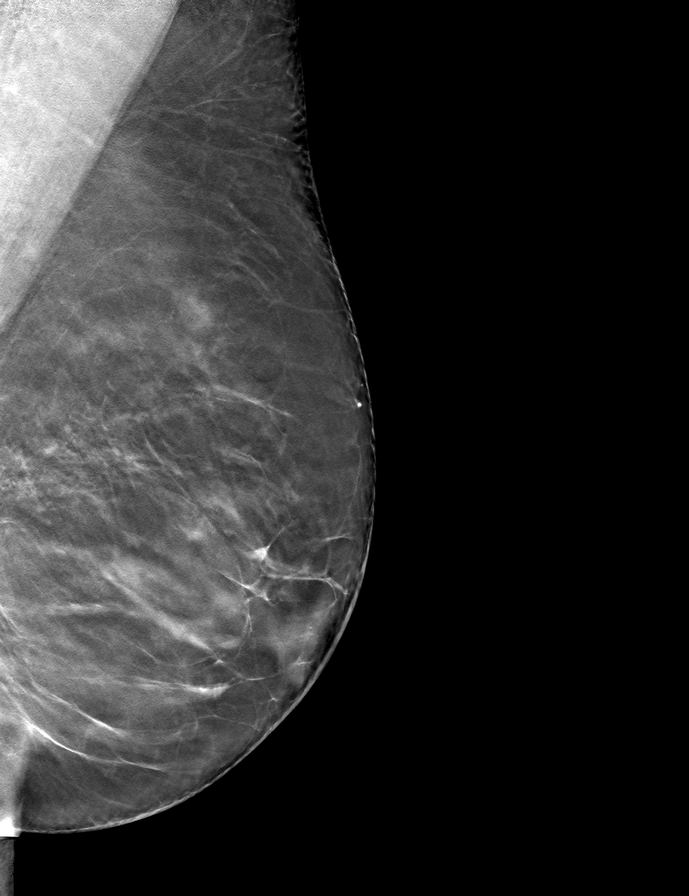

[R CC tomo · tomo slice 41/81.0]
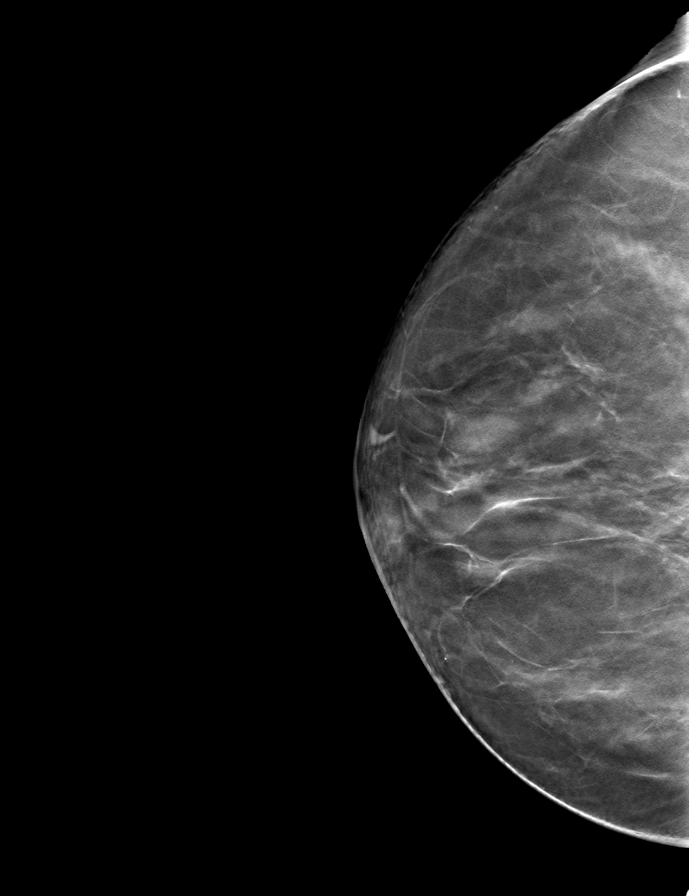

[9 of 24 positions shown; findings below may reference images not displayed]

ACR Breast Density Category b: There are scattered areas of
fibroglandular density.
FINDINGS: There are no findings suspicious for malignancy. Images were
processed with CAD.
IMPRESSION: No mammographic evidence of malignancy. A result letter of this
screening mammogram will be mailed directly to the patient.

RECOMMENDATION:
Screening mammogram in one year. (Code:[id number])

BI-RADS CATEGORY  1: Negative.

## 2019-05-24 DIAGNOSIS — M1711 Unilateral primary osteoarthritis, right knee: Secondary | ICD-10-CM | POA: Diagnosis not present

## 2019-05-24 DIAGNOSIS — M1712 Unilateral primary osteoarthritis, left knee: Secondary | ICD-10-CM | POA: Diagnosis not present

## 2019-05-24 DIAGNOSIS — M25561 Pain in right knee: Secondary | ICD-10-CM | POA: Diagnosis not present

## 2019-05-24 DIAGNOSIS — M170 Bilateral primary osteoarthritis of knee: Secondary | ICD-10-CM | POA: Diagnosis not present

## 2019-07-26 DIAGNOSIS — Z1389 Encounter for screening for other disorder: Secondary | ICD-10-CM | POA: Diagnosis not present

## 2019-07-26 DIAGNOSIS — Z131 Encounter for screening for diabetes mellitus: Secondary | ICD-10-CM | POA: Diagnosis not present

## 2019-07-26 DIAGNOSIS — R7309 Other abnormal glucose: Secondary | ICD-10-CM | POA: Diagnosis not present

## 2019-07-26 DIAGNOSIS — E559 Vitamin D deficiency, unspecified: Secondary | ICD-10-CM | POA: Diagnosis not present

## 2019-07-26 DIAGNOSIS — M5030 Other cervical disc degeneration, unspecified cervical region: Secondary | ICD-10-CM | POA: Diagnosis not present

## 2019-07-26 DIAGNOSIS — M85852 Other specified disorders of bone density and structure, left thigh: Secondary | ICD-10-CM | POA: Diagnosis not present

## 2019-07-26 DIAGNOSIS — N39498 Other specified urinary incontinence: Secondary | ICD-10-CM | POA: Diagnosis not present

## 2019-07-26 DIAGNOSIS — Z7189 Other specified counseling: Secondary | ICD-10-CM | POA: Diagnosis not present

## 2019-07-26 DIAGNOSIS — Z0000 Encounter for general adult medical examination without abnormal findings: Secondary | ICD-10-CM | POA: Diagnosis not present

## 2020-04-01 DIAGNOSIS — B029 Zoster without complications: Secondary | ICD-10-CM | POA: Diagnosis not present

## 2020-04-01 DIAGNOSIS — R21 Rash and other nonspecific skin eruption: Secondary | ICD-10-CM | POA: Diagnosis not present

## 2020-04-01 DIAGNOSIS — Z2821 Immunization not carried out because of patient refusal: Secondary | ICD-10-CM | POA: Diagnosis not present

## 2020-06-12 DIAGNOSIS — H8113 Benign paroxysmal vertigo, bilateral: Secondary | ICD-10-CM | POA: Diagnosis not present

## 2021-06-17 DIAGNOSIS — Z1211 Encounter for screening for malignant neoplasm of colon: Secondary | ICD-10-CM | POA: Diagnosis not present

## 2021-06-17 DIAGNOSIS — M85859 Other specified disorders of bone density and structure, unspecified thigh: Secondary | ICD-10-CM | POA: Diagnosis not present

## 2021-06-17 DIAGNOSIS — E785 Hyperlipidemia, unspecified: Secondary | ICD-10-CM | POA: Diagnosis not present

## 2021-06-17 DIAGNOSIS — Z1231 Encounter for screening mammogram for malignant neoplasm of breast: Secondary | ICD-10-CM | POA: Diagnosis not present

## 2021-06-17 DIAGNOSIS — E663 Overweight: Secondary | ICD-10-CM | POA: Diagnosis not present

## 2021-06-17 DIAGNOSIS — Z7185 Encounter for immunization safety counseling: Secondary | ICD-10-CM | POA: Diagnosis not present

## 2021-06-17 DIAGNOSIS — Z0000 Encounter for general adult medical examination without abnormal findings: Secondary | ICD-10-CM | POA: Diagnosis not present

## 2021-06-20 ENCOUNTER — Other Ambulatory Visit: Payer: Self-pay | Admitting: Family Medicine

## 2021-06-20 DIAGNOSIS — M85859 Other specified disorders of bone density and structure, unspecified thigh: Secondary | ICD-10-CM

## 2021-06-20 DIAGNOSIS — Z1231 Encounter for screening mammogram for malignant neoplasm of breast: Secondary | ICD-10-CM

## 2021-06-25 DIAGNOSIS — E785 Hyperlipidemia, unspecified: Secondary | ICD-10-CM | POA: Diagnosis not present

## 2021-07-07 DIAGNOSIS — H2513 Age-related nuclear cataract, bilateral: Secondary | ICD-10-CM | POA: Diagnosis not present

## 2021-07-30 ENCOUNTER — Ambulatory Visit
Admission: RE | Admit: 2021-07-30 | Discharge: 2021-07-30 | Disposition: A | Discharge status: Home / Self Care | Payer: Medicare PPO | Source: Ambulatory Visit | Attending: Family Medicine | Admitting: Family Medicine

## 2021-07-30 DIAGNOSIS — Z1231 Encounter for screening mammogram for malignant neoplasm of breast: Secondary | ICD-10-CM

## 2021-12-31 ENCOUNTER — Other Ambulatory Visit: Payer: Medicare Other

## 2022-01-18 DIAGNOSIS — U071 COVID-19: Secondary | ICD-10-CM | POA: Diagnosis not present

## 2022-01-18 DIAGNOSIS — R519 Headache, unspecified: Secondary | ICD-10-CM | POA: Diagnosis not present

## 2022-01-18 DIAGNOSIS — R110 Nausea: Secondary | ICD-10-CM | POA: Diagnosis not present

## 2022-06-01 ENCOUNTER — Ambulatory Visit
Admission: RE | Admit: 2022-06-01 | Discharge: 2022-06-01 | Disposition: A | Discharge status: Home / Self Care | Payer: Medicare PPO | Source: Ambulatory Visit | Attending: Family Medicine | Admitting: Family Medicine

## 2022-06-01 DIAGNOSIS — M85859 Other specified disorders of bone density and structure, unspecified thigh: Secondary | ICD-10-CM

## 2022-06-01 DIAGNOSIS — Z780 Asymptomatic menopausal state: Secondary | ICD-10-CM | POA: Diagnosis not present

## 2022-06-01 DIAGNOSIS — M85852 Other specified disorders of bone density and structure, left thigh: Secondary | ICD-10-CM | POA: Diagnosis not present

## 2022-07-15 DIAGNOSIS — I872 Venous insufficiency (chronic) (peripheral): Secondary | ICD-10-CM | POA: Diagnosis not present

## 2022-07-15 DIAGNOSIS — R0789 Other chest pain: Secondary | ICD-10-CM | POA: Diagnosis not present

## 2022-07-15 DIAGNOSIS — M25552 Pain in left hip: Secondary | ICD-10-CM | POA: Diagnosis not present

## 2022-07-15 DIAGNOSIS — Z6826 Body mass index (BMI) 26.0-26.9, adult: Secondary | ICD-10-CM | POA: Diagnosis not present

## 2022-07-22 DIAGNOSIS — M79604 Pain in right leg: Secondary | ICD-10-CM | POA: Diagnosis not present

## 2022-07-22 DIAGNOSIS — I83892 Varicose veins of left lower extremities with other complications: Secondary | ICD-10-CM | POA: Diagnosis not present

## 2022-07-22 DIAGNOSIS — M79661 Pain in right lower leg: Secondary | ICD-10-CM | POA: Diagnosis not present

## 2022-07-22 DIAGNOSIS — M79662 Pain in left lower leg: Secondary | ICD-10-CM | POA: Diagnosis not present

## 2022-08-11 DIAGNOSIS — M25562 Pain in left knee: Secondary | ICD-10-CM | POA: Diagnosis not present

## 2022-08-13 DIAGNOSIS — Z6826 Body mass index (BMI) 26.0-26.9, adult: Secondary | ICD-10-CM | POA: Diagnosis not present

## 2022-08-13 DIAGNOSIS — I872 Venous insufficiency (chronic) (peripheral): Secondary | ICD-10-CM | POA: Diagnosis not present

## 2022-08-13 DIAGNOSIS — M7062 Trochanteric bursitis, left hip: Secondary | ICD-10-CM | POA: Diagnosis not present

## 2022-08-13 DIAGNOSIS — G8929 Other chronic pain: Secondary | ICD-10-CM | POA: Diagnosis not present

## 2022-08-13 DIAGNOSIS — M25561 Pain in right knee: Secondary | ICD-10-CM | POA: Diagnosis not present

## 2022-08-13 DIAGNOSIS — E7800 Pure hypercholesterolemia, unspecified: Secondary | ICD-10-CM | POA: Diagnosis not present

## 2022-08-13 DIAGNOSIS — M25562 Pain in left knee: Secondary | ICD-10-CM | POA: Diagnosis not present

## 2022-08-13 DIAGNOSIS — M8580 Other specified disorders of bone density and structure, unspecified site: Secondary | ICD-10-CM | POA: Diagnosis not present

## 2022-08-13 DIAGNOSIS — R0789 Other chest pain: Secondary | ICD-10-CM | POA: Diagnosis not present

## 2022-08-18 DIAGNOSIS — M25562 Pain in left knee: Secondary | ICD-10-CM | POA: Diagnosis not present

## 2022-08-21 DIAGNOSIS — M25562 Pain in left knee: Secondary | ICD-10-CM | POA: Diagnosis not present

## 2022-08-25 DIAGNOSIS — M25562 Pain in left knee: Secondary | ICD-10-CM | POA: Diagnosis not present

## 2022-08-27 DIAGNOSIS — M25562 Pain in left knee: Secondary | ICD-10-CM | POA: Diagnosis not present

## 2022-08-31 DIAGNOSIS — M25562 Pain in left knee: Secondary | ICD-10-CM | POA: Diagnosis not present

## 2022-08-31 DIAGNOSIS — H2513 Age-related nuclear cataract, bilateral: Secondary | ICD-10-CM | POA: Diagnosis not present

## 2022-09-02 DIAGNOSIS — M25562 Pain in left knee: Secondary | ICD-10-CM | POA: Diagnosis not present

## 2022-09-07 DIAGNOSIS — M25562 Pain in left knee: Secondary | ICD-10-CM | POA: Diagnosis not present

## 2022-09-09 DIAGNOSIS — M25562 Pain in left knee: Secondary | ICD-10-CM | POA: Diagnosis not present

## 2022-09-15 DIAGNOSIS — M25562 Pain in left knee: Secondary | ICD-10-CM | POA: Diagnosis not present

## 2022-09-17 DIAGNOSIS — M25562 Pain in left knee: Secondary | ICD-10-CM | POA: Diagnosis not present

## 2022-09-21 DIAGNOSIS — M25562 Pain in left knee: Secondary | ICD-10-CM | POA: Diagnosis not present

## 2022-09-23 DIAGNOSIS — M25562 Pain in left knee: Secondary | ICD-10-CM | POA: Diagnosis not present

## 2022-10-01 DIAGNOSIS — M25562 Pain in left knee: Secondary | ICD-10-CM | POA: Diagnosis not present

## 2022-10-07 DIAGNOSIS — M25562 Pain in left knee: Secondary | ICD-10-CM | POA: Diagnosis not present

## 2022-10-21 DIAGNOSIS — M25562 Pain in left knee: Secondary | ICD-10-CM | POA: Diagnosis not present

## 2022-11-11 DIAGNOSIS — I83892 Varicose veins of left lower extremities with other complications: Secondary | ICD-10-CM | POA: Diagnosis not present

## 2022-11-11 DIAGNOSIS — R600 Localized edema: Secondary | ICD-10-CM | POA: Diagnosis not present

## 2022-11-19 ENCOUNTER — Other Ambulatory Visit (HOSPITAL_BASED_OUTPATIENT_CLINIC_OR_DEPARTMENT_OTHER): Payer: Self-pay | Admitting: Family Medicine

## 2022-11-19 DIAGNOSIS — Z23 Encounter for immunization: Secondary | ICD-10-CM | POA: Diagnosis not present

## 2022-11-19 DIAGNOSIS — R0789 Other chest pain: Secondary | ICD-10-CM | POA: Diagnosis not present

## 2022-11-19 DIAGNOSIS — Z6826 Body mass index (BMI) 26.0-26.9, adult: Secondary | ICD-10-CM | POA: Diagnosis not present

## 2022-11-19 DIAGNOSIS — I83892 Varicose veins of left lower extremities with other complications: Secondary | ICD-10-CM | POA: Diagnosis not present

## 2022-11-30 ENCOUNTER — Ambulatory Visit (HOSPITAL_COMMUNITY)
Admission: RE | Admit: 2022-11-30 | Discharge: 2022-11-30 | Disposition: A | Discharge status: Home / Self Care | Payer: Medicare PPO | Source: Ambulatory Visit | Attending: Family Medicine | Admitting: Family Medicine

## 2022-11-30 DIAGNOSIS — R0789 Other chest pain: Secondary | ICD-10-CM | POA: Insufficient documentation

## 2022-12-09 DIAGNOSIS — I83892 Varicose veins of left lower extremities with other complications: Secondary | ICD-10-CM | POA: Diagnosis not present

## 2022-12-10 DIAGNOSIS — E7800 Pure hypercholesterolemia, unspecified: Secondary | ICD-10-CM | POA: Diagnosis not present

## 2022-12-10 DIAGNOSIS — R931 Abnormal findings on diagnostic imaging of heart and coronary circulation: Secondary | ICD-10-CM | POA: Diagnosis not present

## 2022-12-10 DIAGNOSIS — M85852 Other specified disorders of bone density and structure, left thigh: Secondary | ICD-10-CM | POA: Diagnosis not present

## 2022-12-11 DIAGNOSIS — Z09 Encounter for follow-up examination after completed treatment for conditions other than malignant neoplasm: Secondary | ICD-10-CM | POA: Diagnosis not present

## 2022-12-11 DIAGNOSIS — I83812 Varicose veins of left lower extremities with pain: Secondary | ICD-10-CM | POA: Diagnosis not present

## 2022-12-29 DIAGNOSIS — I83892 Varicose veins of left lower extremities with other complications: Secondary | ICD-10-CM | POA: Diagnosis not present

## 2023-01-12 DIAGNOSIS — I83892 Varicose veins of left lower extremities with other complications: Secondary | ICD-10-CM | POA: Diagnosis not present

## 2023-01-12 DIAGNOSIS — I87392 Chronic venous hypertension (idiopathic) with other complications of left lower extremity: Secondary | ICD-10-CM | POA: Diagnosis not present

## 2023-01-26 DIAGNOSIS — I83892 Varicose veins of left lower extremities with other complications: Secondary | ICD-10-CM | POA: Diagnosis not present

## 2023-01-26 DIAGNOSIS — I83812 Varicose veins of left lower extremities with pain: Secondary | ICD-10-CM | POA: Diagnosis not present

## 2023-01-26 DIAGNOSIS — M7989 Other specified soft tissue disorders: Secondary | ICD-10-CM | POA: Diagnosis not present

## 2023-03-03 DIAGNOSIS — Z23 Encounter for immunization: Secondary | ICD-10-CM | POA: Diagnosis not present

## 2023-03-11 DIAGNOSIS — R931 Abnormal findings on diagnostic imaging of heart and coronary circulation: Secondary | ICD-10-CM | POA: Diagnosis not present

## 2023-03-11 DIAGNOSIS — E785 Hyperlipidemia, unspecified: Secondary | ICD-10-CM | POA: Diagnosis not present

## 2023-03-25 DIAGNOSIS — R931 Abnormal findings on diagnostic imaging of heart and coronary circulation: Secondary | ICD-10-CM | POA: Diagnosis not present

## 2023-03-25 DIAGNOSIS — E785 Hyperlipidemia, unspecified: Secondary | ICD-10-CM | POA: Diagnosis not present

## 2023-03-25 DIAGNOSIS — R0789 Other chest pain: Secondary | ICD-10-CM | POA: Diagnosis not present

## 2023-03-25 DIAGNOSIS — Z6826 Body mass index (BMI) 26.0-26.9, adult: Secondary | ICD-10-CM | POA: Diagnosis not present

## 2023-03-25 DIAGNOSIS — F419 Anxiety disorder, unspecified: Secondary | ICD-10-CM | POA: Diagnosis not present

## 2023-04-28 DIAGNOSIS — I872 Venous insufficiency (chronic) (peripheral): Secondary | ICD-10-CM | POA: Diagnosis not present

## 2023-04-28 DIAGNOSIS — I87392 Chronic venous hypertension (idiopathic) with other complications of left lower extremity: Secondary | ICD-10-CM | POA: Diagnosis not present

## 2023-06-23 DIAGNOSIS — E559 Vitamin D deficiency, unspecified: Secondary | ICD-10-CM | POA: Diagnosis not present

## 2023-06-23 DIAGNOSIS — R0789 Other chest pain: Secondary | ICD-10-CM | POA: Diagnosis not present

## 2023-06-23 DIAGNOSIS — E785 Hyperlipidemia, unspecified: Secondary | ICD-10-CM | POA: Diagnosis not present

## 2023-06-23 DIAGNOSIS — R931 Abnormal findings on diagnostic imaging of heart and coronary circulation: Secondary | ICD-10-CM | POA: Diagnosis not present

## 2023-06-30 DIAGNOSIS — Z1331 Encounter for screening for depression: Secondary | ICD-10-CM | POA: Diagnosis not present

## 2023-06-30 DIAGNOSIS — R2689 Other abnormalities of gait and mobility: Secondary | ICD-10-CM | POA: Diagnosis not present

## 2023-06-30 DIAGNOSIS — Z6826 Body mass index (BMI) 26.0-26.9, adult: Secondary | ICD-10-CM | POA: Diagnosis not present

## 2023-06-30 DIAGNOSIS — R030 Elevated blood-pressure reading, without diagnosis of hypertension: Secondary | ICD-10-CM | POA: Diagnosis not present

## 2023-06-30 DIAGNOSIS — N3946 Mixed incontinence: Secondary | ICD-10-CM | POA: Diagnosis not present

## 2023-06-30 DIAGNOSIS — Z0000 Encounter for general adult medical examination without abnormal findings: Secondary | ICD-10-CM | POA: Diagnosis not present

## 2023-06-30 DIAGNOSIS — Z860100 Personal history of colon polyps, unspecified: Secondary | ICD-10-CM | POA: Diagnosis not present

## 2023-06-30 DIAGNOSIS — E785 Hyperlipidemia, unspecified: Secondary | ICD-10-CM | POA: Diagnosis not present

## 2023-06-30 DIAGNOSIS — M85852 Other specified disorders of bone density and structure, left thigh: Secondary | ICD-10-CM | POA: Diagnosis not present

## 2023-07-05 ENCOUNTER — Other Ambulatory Visit: Payer: Self-pay | Admitting: Family Medicine

## 2023-07-05 DIAGNOSIS — Z1231 Encounter for screening mammogram for malignant neoplasm of breast: Secondary | ICD-10-CM

## 2023-07-28 ENCOUNTER — Ambulatory Visit
Admission: RE | Admit: 2023-07-28 | Discharge: 2023-07-28 | Disposition: A | Discharge status: Home / Self Care | Source: Ambulatory Visit | Attending: Family Medicine | Admitting: Family Medicine

## 2023-07-28 DIAGNOSIS — Z1231 Encounter for screening mammogram for malignant neoplasm of breast: Secondary | ICD-10-CM | POA: Diagnosis not present

## 2023-09-07 DIAGNOSIS — H2513 Age-related nuclear cataract, bilateral: Secondary | ICD-10-CM | POA: Diagnosis not present

## 2023-09-21 DIAGNOSIS — I83811 Varicose veins of right lower extremities with pain: Secondary | ICD-10-CM | POA: Diagnosis not present

## 2023-09-21 DIAGNOSIS — I872 Venous insufficiency (chronic) (peripheral): Secondary | ICD-10-CM | POA: Diagnosis not present

## 2023-09-21 DIAGNOSIS — M79604 Pain in right leg: Secondary | ICD-10-CM | POA: Diagnosis not present
